# Patient Record
Sex: Female | Born: 1955 | Hispanic: Yes | Marital: Married | Smoking: Never smoker
Health system: Southern US, Community
[De-identification: ages and names within clinical notes are randomized; demographics above are authoritative.]

## PROBLEM LIST (undated history)

## (undated) DIAGNOSIS — E119 Type 2 diabetes mellitus without complications: Secondary | ICD-10-CM

---

## 2006-09-01 ENCOUNTER — Ambulatory Visit: Payer: Self-pay | Admitting: Family Medicine

## 2008-09-28 ENCOUNTER — Ambulatory Visit: Payer: Self-pay

## 2008-11-25 ENCOUNTER — Ambulatory Visit: Payer: Self-pay

## 2010-11-08 ENCOUNTER — Ambulatory Visit: Payer: Self-pay

## 2011-12-12 ENCOUNTER — Ambulatory Visit: Payer: Self-pay

## 2012-12-17 ENCOUNTER — Ambulatory Visit: Payer: Self-pay

## 2013-12-17 ENCOUNTER — Ambulatory Visit: Payer: Self-pay

## 2014-12-21 ENCOUNTER — Ambulatory Visit: Payer: Self-pay

## 2015-01-01 ENCOUNTER — Ambulatory Visit: Payer: Self-pay

## 2016-02-16 ENCOUNTER — Ambulatory Visit
Admission: RE | Admit: 2016-02-16 | Discharge: 2016-02-16 | Disposition: A | Discharge status: Home / Self Care | Payer: Self-pay | Source: Ambulatory Visit | Attending: Oncology | Admitting: Oncology

## 2016-02-16 ENCOUNTER — Ambulatory Visit: Payer: Self-pay | Attending: Oncology | Admitting: *Deleted

## 2016-02-16 ENCOUNTER — Encounter: Payer: Self-pay | Admitting: *Deleted

## 2016-02-16 VITALS — BP 117/75 | HR 72 | Temp 97.4°F | Ht 61.42 in | Wt 152.9 lb

## 2016-02-16 DIAGNOSIS — Z0000 Encounter for general adult medical examination without abnormal findings: Secondary | ICD-10-CM

## 2016-02-16 NOTE — Patient Instructions (Signed)
Gave patient hand-out, Women Staying Healthy, Active and Well from [facility], with education on breast health, pap smears, heart and colon health. 

## 2016-02-16 NOTE — Progress Notes (Signed)
Subjective:     Patient ID: Sharon Lynch, female   DOB: September 24, 1956, 60 y.o.   MRN: [id number]  HPI   Review of Systems     Objective:   Physical Exam  Pulmonary/Chest: Right breast exhibits no inverted nipple, no mass, no nipple discharge, no skin change and no tenderness. Left breast exhibits no inverted nipple, no mass, no nipple discharge, no skin change and no tenderness. Breasts are symmetrical.  Abdominal: There is no splenomegaly or hepatomegaly.    Genitourinary: No labial fusion. There is no rash, tenderness, lesion or injury on the right labia. There is no rash, tenderness, lesion or injury on the left labia. Cervix exhibits no motion tenderness, no discharge and no friability. Right adnexum displays no mass, no tenderness and no fullness. Left adnexum displays no mass, no tenderness and no fullness. No erythema, tenderness or bleeding in the vagina. No foreign body around the vagina. No signs of injury around the vagina. No vaginal discharge found.  Os is very stenosed       Assessment:     60 year old Hispanic [facility]  Maryjane HurterOtto the interpreter, present during the interview, but stepped out during the exam per patient request.  Clinical breast exam unremarkable.  Taught self breast awareness.  Pelvic exam without masses or lesions.  Patient will be due for pap 2019 if no HPV or 2021 if HPV negative.  Patient has been screened for eligibility.  She does not have any insurance, Medicare or Medicaid.  She also meets financial eligibility.  Hand-out given on the Affordable Care Act.      Plan:     Screening mammogram ordered.  Will follow-up per [facility] protocol.

## 2016-02-22 ENCOUNTER — Encounter: Payer: Self-pay | Admitting: *Deleted

## 2016-02-22 NOTE — Progress Notes (Signed)
Letter mailed to inform patient of her mammogram results.  She is to follow-up in one year with annual screening.  HSIS to Christy.  

## 2017-02-21 ENCOUNTER — Ambulatory Visit: Payer: Self-pay | Attending: Oncology | Admitting: *Deleted

## 2017-02-21 ENCOUNTER — Ambulatory Visit
Admission: RE | Admit: 2017-02-21 | Discharge: 2017-02-21 | Disposition: A | Discharge status: Home / Self Care | Payer: Self-pay | Source: Ambulatory Visit | Attending: Oncology | Admitting: Oncology

## 2017-02-21 ENCOUNTER — Encounter: Payer: Self-pay | Admitting: *Deleted

## 2017-02-21 VITALS — BP 120/78 | HR 72 | Temp 92.2°F | Resp 18 | Ht 62.0 in | Wt 156.2 lb

## 2017-02-21 DIAGNOSIS — Z0000 Encounter for general adult medical examination without abnormal findings: Secondary | ICD-10-CM

## 2017-02-21 NOTE — Patient Instructions (Signed)
Gave patient hand-out, Women Staying Healthy, Active and Well from [facility], with education on breast health, pap smears, heart and colon health. 

## 2017-02-21 NOTE — Progress Notes (Signed)
Subjective:     Patient ID: Sharon Lynch, female   DOB: 15-Aug-1956, 61 y.o.   MRN: [id number]  HPI   Review of Systems     Objective:   Physical Exam  Pulmonary/Chest: Right breast exhibits no inverted nipple, no mass, no nipple discharge, no skin change and no tenderness. Left breast exhibits no inverted nipple, no mass, no nipple discharge, no skin change and no tenderness. Breasts are symmetrical.  Abdominal: There is no splenomegaly or hepatomegaly.    Genitourinary: No labial fusion. There is no rash, tenderness, lesion or injury on the right labia. There is no rash, tenderness, lesion or injury on the left labia. Uterus is not deviated, not enlarged and not fixed. Cervix exhibits no motion tenderness, no discharge and no friability. Right adnexum displays no mass, no tenderness and no fullness. Left adnexum displays no mass, no tenderness and no fullness. No erythema, tenderness or bleeding in the vagina. No foreign body in the vagina. No signs of injury around the vagina. No vaginal discharge found.       Assessment:     61 year old Hispanic female returns to [facility] for annual screening.  Sharon Lynch, the interpreter present during the interview and exam.  Clinical breast exam unremarkable.  Taught self breast awareness.  Pelvic exam without masses or lesions.  Last pap 12/17/13 was -/-.  Next pap due 2020.  Patient has been screened for eligibility.  She does not have any insurance, Medicare or Medicaid.  She also meets financial eligibility.  Hand-out given on the Affordable Care Act.    Plan:     Screening mammogram ordered.  Will follow-up per [facility] protocol.

## 2017-02-22 ENCOUNTER — Encounter: Payer: Self-pay | Admitting: *Deleted

## 2017-02-22 NOTE — Progress Notes (Signed)
Letter mailed to inform patient of her normal mammogram results.  She is to follow-up in one year with annual screening.  HSIS to Christy. 

## 2018-03-18 ENCOUNTER — Ambulatory Visit: Payer: Self-pay

## 2018-03-20 ENCOUNTER — Ambulatory Visit: Payer: Self-pay | Attending: Oncology | Admitting: *Deleted

## 2018-03-20 ENCOUNTER — Encounter: Payer: Self-pay | Admitting: *Deleted

## 2018-03-20 ENCOUNTER — Other Ambulatory Visit: Payer: Self-pay

## 2018-03-20 ENCOUNTER — Ambulatory Visit
Admission: RE | Admit: 2018-03-20 | Discharge: 2018-03-20 | Disposition: A | Discharge status: Home / Self Care | Payer: Self-pay | Source: Ambulatory Visit | Attending: Oncology | Admitting: Oncology

## 2018-03-20 VITALS — BP 113/66 | Resp 12 | Ht 62.0 in | Wt 149.0 lb

## 2018-03-20 DIAGNOSIS — Z0000 Encounter for general adult medical examination without abnormal findings: Secondary | ICD-10-CM

## 2018-03-20 NOTE — Patient Instructions (Signed)
Gave patient hand-out, Women Staying Healthy, Active and Well from [facility], with education on breast health, pap smears, heart and colon health. 

## 2018-03-20 NOTE — Progress Notes (Signed)
Letter mailed from [facility] to inform patient of her normal mammogram results.  Patient is to follow-up with annual screening in one year.  HSIS to Christy. 

## 2018-03-20 NOTE — Progress Notes (Signed)
  Subjective:     Patient ID: Sharon Lynch, female   DOB: Jul 26, 1956, 62 y.o.   MRN: [id number]  HPI   Review of Systems     Objective:   Physical Exam  Pulmonary/Chest: Right breast exhibits no inverted nipple, no mass, no nipple discharge, no skin change and no tenderness. Left breast exhibits no inverted nipple, no mass, no nipple discharge, no skin change and no tenderness.       Assessment:     62 year old Hispanic female returns to [facility] for annual screening.  Myriam Jacobson the interpreter present during the interview and exam.  Clinical breast exam unremarkable.  Taught self breast awareness.  Last pap on 12/17/13 was negative / negative.  Next pap due in 2020. Patient has been screened for eligibility.  She does not have any insurance, Medicare or Medicaid.  She also meets financial eligibility.  Hand-out given on the Affordable Care Act.    Plan:     Screening mammogram ordered.  Will follow-up per [facility] protocol.

## 2019-02-08 ENCOUNTER — Encounter: Payer: Self-pay | Admitting: Emergency Medicine

## 2019-02-08 ENCOUNTER — Other Ambulatory Visit: Payer: Self-pay

## 2019-02-08 ENCOUNTER — Emergency Department

## 2019-02-08 ENCOUNTER — Emergency Department
Admission: EM | Admit: 2019-02-08 | Discharge: 2019-02-08 | Disposition: A | Discharge status: Home / Self Care | Attending: Emergency Medicine | Admitting: Emergency Medicine

## 2019-02-08 DIAGNOSIS — Z23 Encounter for immunization: Secondary | ICD-10-CM | POA: Diagnosis not present

## 2019-02-08 DIAGNOSIS — S60352A Superficial foreign body of left thumb, initial encounter: Secondary | ICD-10-CM | POA: Diagnosis not present

## 2019-02-08 DIAGNOSIS — Y9289 Other specified places as the place of occurrence of the external cause: Secondary | ICD-10-CM | POA: Diagnosis not present

## 2019-02-08 DIAGNOSIS — W458XXA Other foreign body or object entering through skin, initial encounter: Secondary | ICD-10-CM | POA: Diagnosis not present

## 2019-02-08 DIAGNOSIS — M795 Residual foreign body in soft tissue: Secondary | ICD-10-CM

## 2019-02-08 DIAGNOSIS — Y9389 Activity, other specified: Secondary | ICD-10-CM | POA: Diagnosis not present

## 2019-02-08 DIAGNOSIS — Y998 Other external cause status: Secondary | ICD-10-CM | POA: Insufficient documentation

## 2019-02-08 HISTORY — DX: Type 2 diabetes mellitus without complications: E11.9

## 2019-02-08 MED ORDER — CEPHALEXIN 500 MG PO CAPS: 500.0000 mg | ORAL_CAPSULE | Freq: Three times a day (TID) | ORAL | 0 refills | Status: AC

## 2019-02-08 MED ORDER — LIDOCAINE HCL 1 % IJ SOLN
5.0000 mL | Freq: Once | INTRAMUSCULAR | Status: AC
  Administered 2019-02-08: 2 mL
  Filled 2019-02-08: qty 10

## 2019-02-08 MED ORDER — TETANUS-DIPHTH-ACELL PERTUSSIS 5-2.5-18.5 LF-MCG/0.5 IM SUSP
0.5000 mL | Freq: Once | INTRAMUSCULAR | Status: AC
  Administered 2019-02-08: 0.5 mL via INTRAMUSCULAR
  Filled 2019-02-08: qty 0.5

## 2019-02-08 NOTE — ED Triage Notes (Signed)
Pt to ED via POV stating that she was pierced by a staple at work today in her right thumb . Pt is in NAD. At this time.

## 2019-02-08 NOTE — ED Notes (Addendum)
Pt has staple in left thumb from staple gun while at work. 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

## 2019-02-08 NOTE — ED Notes (Signed)
Pt verbalized understanding of d/c instructions, RX, and f/u care. No further questions at this time. Pt ambulatory to the exit with steady gait Interpreter, Marchelle Folks,  used to assist in pt d/c.

## 2019-02-08 NOTE — ED Provider Notes (Signed)
[facility] Emergency Department Provider Note  ____________________________________________  Time seen: Approximately 7:30 PM  I have reviewed the triage vital signs and the nursing notes.   HISTORY  Chief Complaint Finger Injury    HPI Sharon Lynch is a 63 y.o. female presents to the emergency department with a left thumb foreign body, staple, sustained accidentally at work.  Patient does not remember her last tetanus shot.  No numbness or tingling in the left hand.  Patient has had pain at the site where the staple penetrated digit. No alleviating measures have been attempted.         Past Medical History:  Diagnosis Date  . Diabetes mellitus without complication (HCC)     There are no active problems to display for this patient.   Past Surgical History:  Procedure Laterality Date  . CESAREAN SECTION      Prior to Admission medications   Not on File    Allergies Patient has no known allergies.  Family History  Problem Relation Age of Onset  . Breast cancer Neg Hx     Social History Social History   Tobacco Use  . Smoking status: Never Smoker  . Smokeless tobacco: Never Used  Substance Use Topics  . Alcohol use: Not Currently  . Drug use: Not Currently     Review of Systems  Constitutional: No fever/chills Eyes: No visual changes. No discharge ENT: No upper respiratory complaints. Cardiovascular: no chest pain. Respiratory: no cough. No SOB. Gastrointestinal: No abdominal pain.  No nausea, no vomiting.  No diarrhea.  No constipation. Genitourinary: Negative for dysuria. No hematuria Musculoskeletal: Negative for musculoskeletal pain. Skin: Patient has left thumb foreign body.  Neurological: Negative for headaches, focal weakness or numbness.   ____________________________________________   PHYSICAL EXAM:  VITAL SIGNS: ED Triage Vitals  Enc Vitals Group     BP 02/08/19 1752 140/63     Pulse Rate 02/08/19  1752 71     Resp 02/08/19 1752 18     Temp 02/08/19 1752 98.5 F (36.9 C)     Temp Source 02/08/19 1752 Oral     SpO2 02/08/19 1752 97 %     Weight 02/08/19 1804 153 lb (69.4 kg)     Height 02/08/19 1804 5\' 2"  (1.575 m)     Head Circumference --      Peak Flow --      Pain Score 02/08/19 1804 3     Pain Loc --      Pain Edu? --      Excl. in GC? --      Constitutional: Alert and oriented. Well appearing and in no acute distress. Eyes: Conjunctivae are normal. PERRL. EOMI. Head: Atraumatic. Cardiovascular: Normal rate, regular rhythm. Normal S1 and S2.  Good peripheral circulation. Respiratory: Normal respiratory effort without tachypnea or retractions. Lungs CTAB. Good air entry to the bases with no decreased or absent breath sounds. Gastrointestinal: Bowel sounds 4 quadrants. Soft and nontender to palpation. No guarding or rigidity. No palpable masses. No distention. No CVA tenderness. Musculoskeletal: Full range of motion to all extremities. No gross deformities appreciated. Palpable radial pulse  Neurologic:  Normal speech and language. No gross focal neurologic deficits are appreciated.  Skin: Patient has staple foreign body along the volar aspect of the left thumb at fingertip.  Psychiatric: Mood and affect are normal. Speech and behavior are normal. Patient exhibits appropriate insight and judgement.   ____________________________________________   LABS (all labs ordered are listed,  but only abnormal results are displayed)  Labs Reviewed - No data to display ____________________________________________  EKG   ____________________________________________  RADIOLOGY   No results found.  ____________________________________________    PROCEDURES  Procedure(s) performed:    Procedures  Patient's right thumb was anesthetized using 1% lidocaine without epinephrine at staple penetration sites. Staple was removed using hemostat.   Medications  lidocaine  (XYLOCAINE) 1 % (with pres) injection 5 mL (has no administration in time range)     ____________________________________________   INITIAL IMPRESSION / ASSESSMENT AND PLAN / ED COURSE  Pertinent labs & imaging results that were available during my care of the patient were reviewed by me and considered in my medical decision making (see chart for details).  Review of the [place] CSRS was performed in accordance of the [facility] prior to dispensing any controlled drugs.         Assessment and plan Left thumb foreign body Patient presents to the emergency department with a stable foreign body of the left thumb.  Staple was removed without complication and patient was discharged with Keflex.  Patient's tetanus status is updated in the emergency department.    ____________________________________________  FINAL CLINICAL IMPRESSION(S) / ED DIAGNOSES  Final diagnoses:  Foreign body (FB) in soft tissue      NEW MEDICATIONS STARTED DURING THIS VISIT:  ED Discharge Orders    None          This chart was dictated using voice recognition software/Dragon. Despite best efforts to proofread, errors can occur which can change the meaning. Any change was purely unintentional.    Orvil Feil, PA-C 02/08/19 [name]    Phineas Semen, MD 02/08/19 [phone]

## 2020-01-13 ENCOUNTER — Other Ambulatory Visit: Payer: Self-pay | Admitting: Obstetrics and Gynecology

## 2020-01-13 DIAGNOSIS — Z1231 Encounter for screening mammogram for malignant neoplasm of breast: Secondary | ICD-10-CM

## 2020-01-29 ENCOUNTER — Ambulatory Visit
Admission: RE | Admit: 2020-01-29 | Discharge: 2020-01-29 | Disposition: A | Discharge status: Home / Self Care | Payer: BC Managed Care – PPO | Source: Ambulatory Visit | Attending: Obstetrics and Gynecology | Admitting: Obstetrics and Gynecology

## 2020-01-29 DIAGNOSIS — Z1231 Encounter for screening mammogram for malignant neoplasm of breast: Secondary | ICD-10-CM | POA: Insufficient documentation

## 2020-02-08 ENCOUNTER — Ambulatory Visit: Payer: BC Managed Care – PPO | Attending: Internal Medicine

## 2020-02-08 DIAGNOSIS — Z23 Encounter for immunization: Secondary | ICD-10-CM

## 2020-02-08 NOTE — Progress Notes (Signed)
   Covid-19 Vaccination Clinic  Name:  Sharon Lynch    MRN: [id number] DOB: 1956-06-12  02/08/2020  Ms. [name] was observed post Covid-19 immunization for 15 minutes without incident. She was provided with Vaccine Information Sheet and instruction to access the V-Safe system.   Ms. [name] was instructed to call 911 with any severe reactions post vaccine: Marland Kitchen Difficulty breathing  . Swelling of face and throat  . A fast heartbeat  . A bad rash all over body  . Dizziness and weakness   Immunizations Administered    Name Date Dose VIS Date Route   Pfizer COVID-19 Vaccine 02/08/2020  1:46 PM 0.3 mL 10/31/2019 Intramuscular   Manufacturer: [employer], [employer]   Lot: [id number]   NDC: [address]-7

## 2020-02-29 ENCOUNTER — Ambulatory Visit: Payer: BC Managed Care – PPO | Attending: Internal Medicine

## 2020-02-29 DIAGNOSIS — Z23 Encounter for immunization: Secondary | ICD-10-CM

## 2020-02-29 NOTE — Progress Notes (Signed)
   Covid-19 Vaccination Clinic  Name:  Etta Gassett    MRN: [id number] DOB: 1956/02/27  02/29/2020  Ms. [name] was observed post Covid-19 immunization for 15 minutes without incident. She was provided with Vaccine Information Sheet and instruction to access the V-Safe system.   Ms. Merrilee Ancona was instructed to call 911 with any severe reactions post vaccine: Marland Kitchen Difficulty breathing  . Swelling of face and throat  . A fast heartbeat  . A bad rash all over body  . Dizziness and weakness   Immunizations Administered    Name Date Dose VIS Date Route   Pfizer COVID-19 Vaccine 02/29/2020 12:49 PM 0.3 mL 10/31/2019 Intramuscular   Manufacturer: [employer], [employer]   Lot: [id number]   NDC: [address]-5

## 2021-02-02 ENCOUNTER — Ambulatory Visit
Admission: RE | Admit: 2021-02-02 | Discharge: 2021-02-02 | Disposition: A | Discharge status: Home / Self Care | Payer: Self-pay | Source: Ambulatory Visit | Attending: Oncology | Admitting: Oncology

## 2021-02-02 ENCOUNTER — Other Ambulatory Visit: Payer: Self-pay

## 2021-02-02 ENCOUNTER — Ambulatory Visit: Payer: Self-pay | Attending: Oncology

## 2021-02-02 VITALS — BP 136/65 | HR 78 | Temp 98.3°F | Ht 61.81 in | Wt 153.3 lb

## 2021-02-02 DIAGNOSIS — Z0000 Encounter for general adult medical examination without abnormal findings: Secondary | ICD-10-CM

## 2021-02-02 NOTE — Progress Notes (Signed)
  Subjective:     Patient ID: Sharon Lynch, female   DOB: November 29, 1955, 65 y.o.   MRN: [id number]  HPI   Review of Systems     Objective:   Physical Exam Chest:  Breasts:     Right: No swelling, bleeding, inverted nipple, mass, nipple discharge, skin change or tenderness.     Left: No swelling, bleeding, inverted nipple, mass, nipple discharge, skin change or tenderness.          Assessment:  65 year old Hispanic patient returns for annual screening.  Maritza Afanador interpreted.  Patient screened, and meets [facility] eligibility.  Patient does not have insurance, Medicare or Medicaid. Instructed patient on breast self awareness using teach back method.  Clinical breast exam unremarkable.  No mass or lump palpated. Risk Assessment    Risk Scores      02/02/2021   Last edited by: Scarlett Presto, RN   5-year risk: 0.7 %   Lifetime risk: 3.1 %            Plan:     Sent for bilateral screening mammogram.

## 2021-04-07 NOTE — Progress Notes (Signed)
Letter mailed from [facility] to notify of normal mammogram results.  Patient to return in one year for annual screening.  Copy to HSIS. 

## 2022-01-04 ENCOUNTER — Other Ambulatory Visit: Payer: Self-pay | Admitting: Obstetrics and Gynecology

## 2022-01-04 DIAGNOSIS — Z1231 Encounter for screening mammogram for malignant neoplasm of breast: Secondary | ICD-10-CM

## 2022-02-14 ENCOUNTER — Other Ambulatory Visit: Payer: Self-pay

## 2022-02-14 ENCOUNTER — Ambulatory Visit
Admission: RE | Admit: 2022-02-14 | Discharge: 2022-02-14 | Disposition: A | Discharge status: Home / Self Care | Payer: BC Managed Care – PPO | Source: Ambulatory Visit | Attending: Obstetrics and Gynecology | Admitting: Obstetrics and Gynecology

## 2022-02-14 DIAGNOSIS — Z1231 Encounter for screening mammogram for malignant neoplasm of breast: Secondary | ICD-10-CM | POA: Insufficient documentation

## 2022-05-02 IMAGING — MG MM DIGITAL SCREENING BILAT W/ TOMO AND CAD
6 of 10 series · 6 of 30 positions shown · non-contrast
Comparison: Previous exam(s).

CLINICAL DATA: Screening.

EXAM:
DIGITAL SCREENING BILATERAL MAMMOGRAM WITH TOMOSYNTHESIS AND CAD
TECHNIQUE: Bilateral screening digital craniocaudal and mediolateral oblique
mammograms were obtained. Bilateral screening digital breast
tomosynthesis was performed. The images were evaluated with
computer-aided detection.

[L MLO synth-2D]
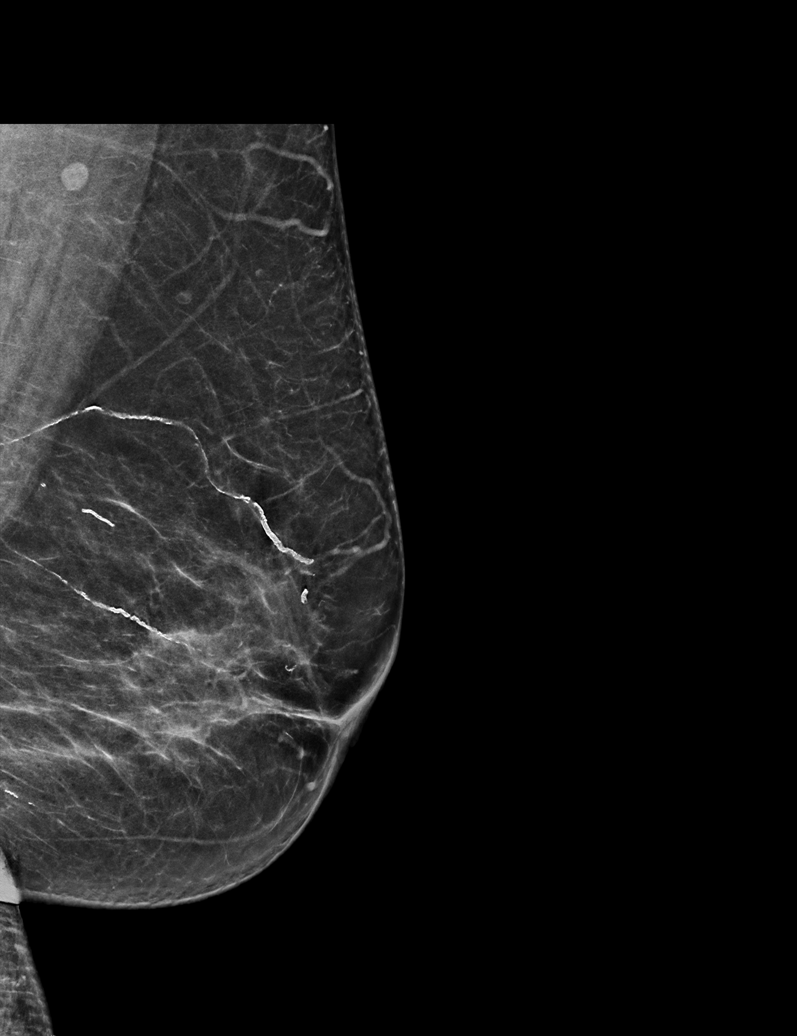

[R MLO synth-2D (1 of 2)]
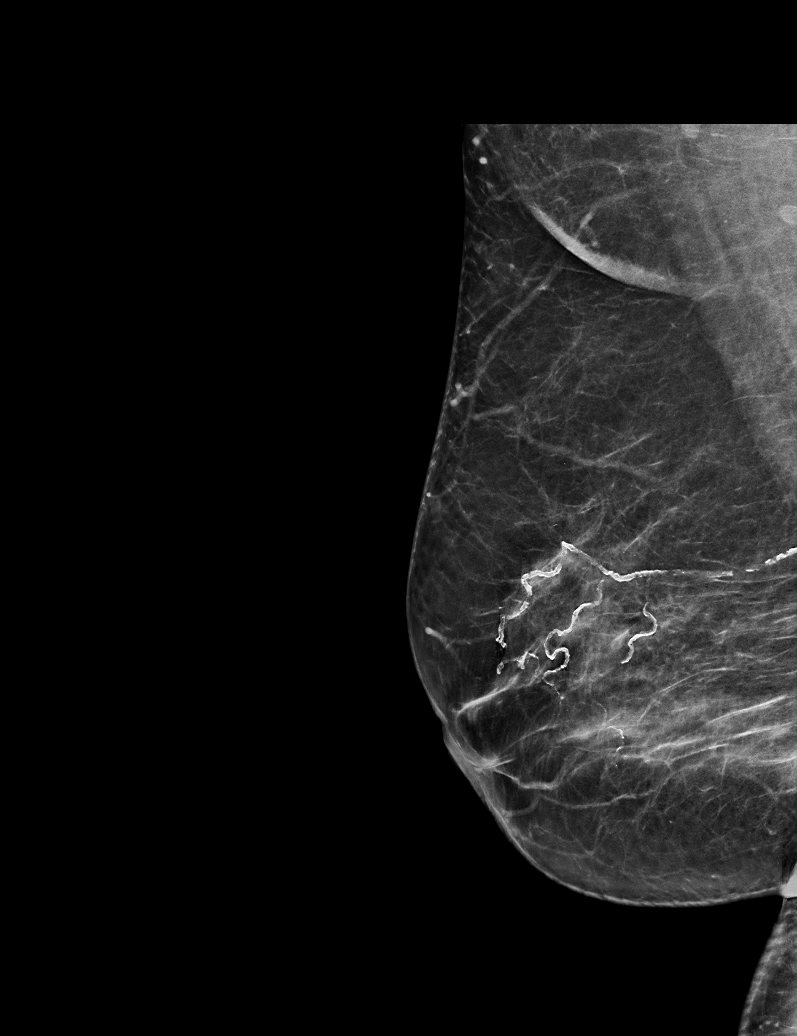

[R MLO synth-2D (2 of 2)]
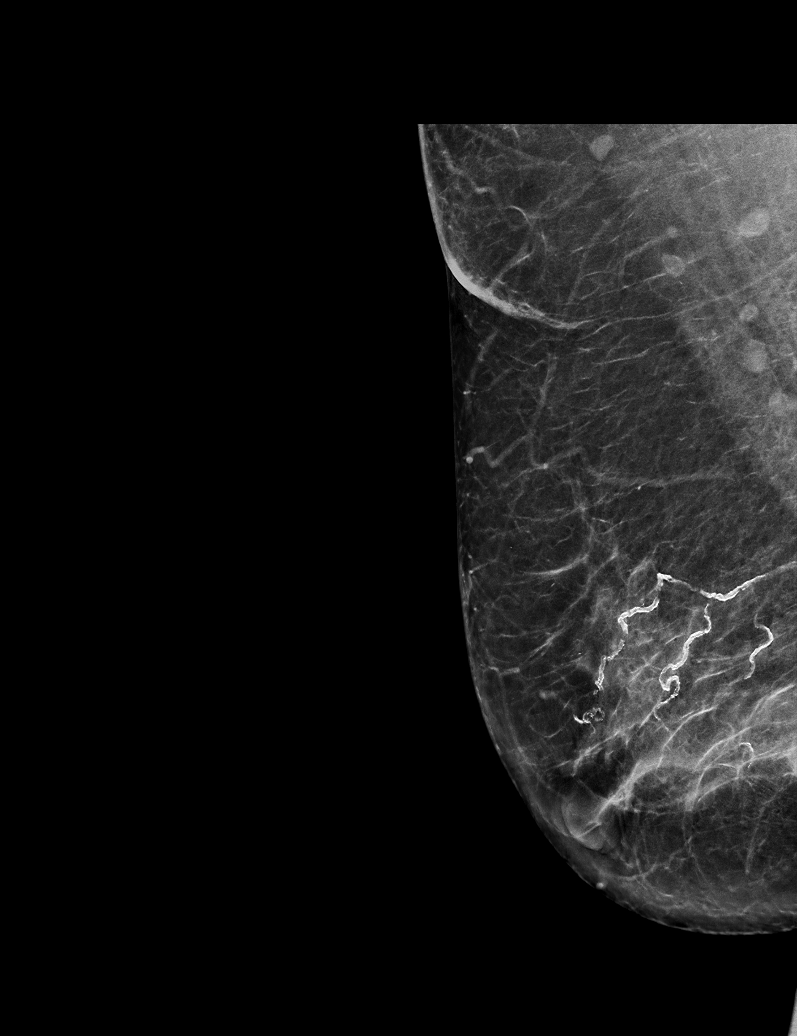

[R CC synth-2D]
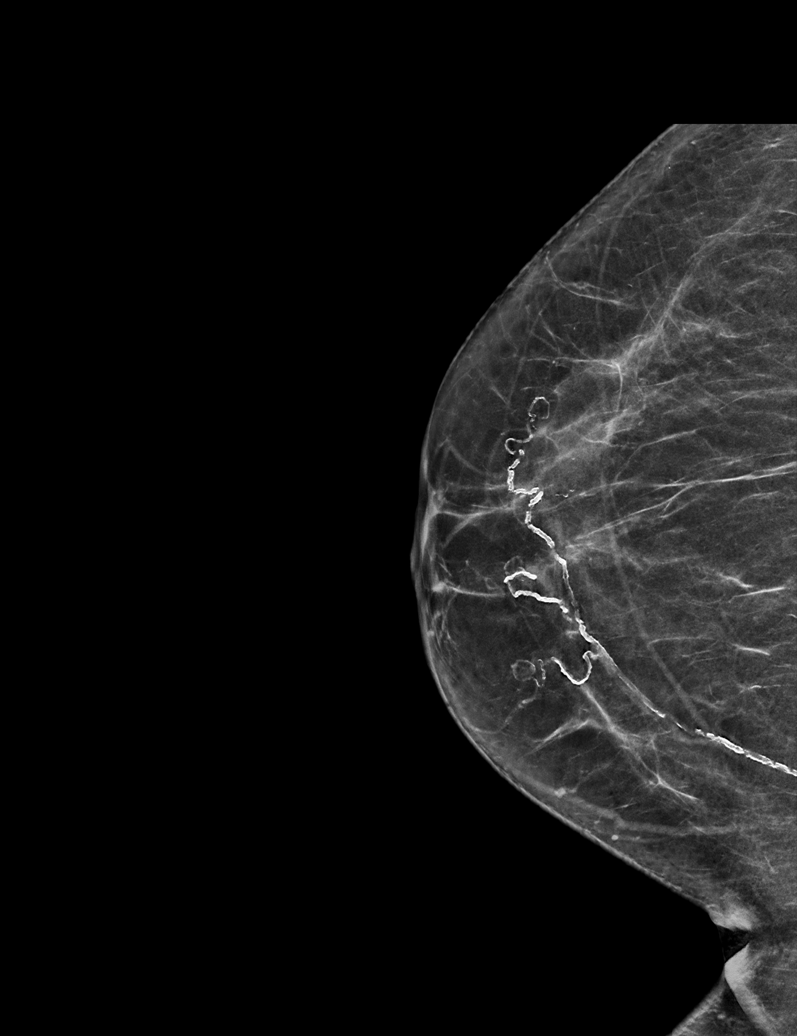

[L CC synth-2D]
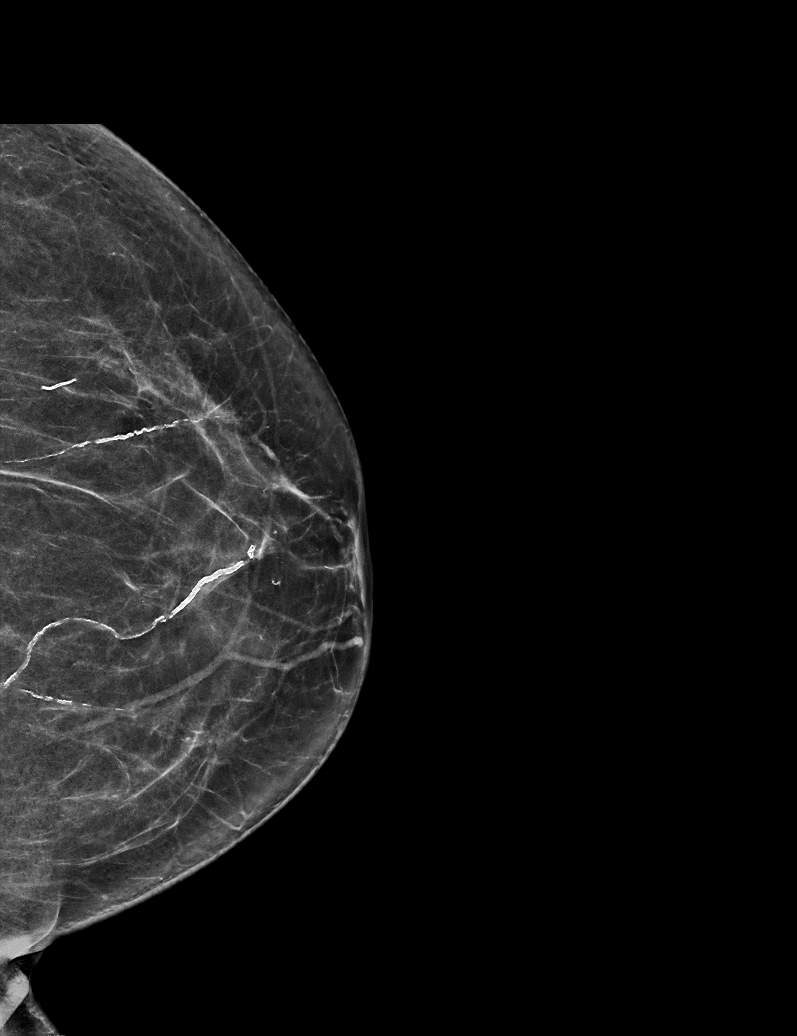

[L CC tomo · tomo slice 30/59.0]
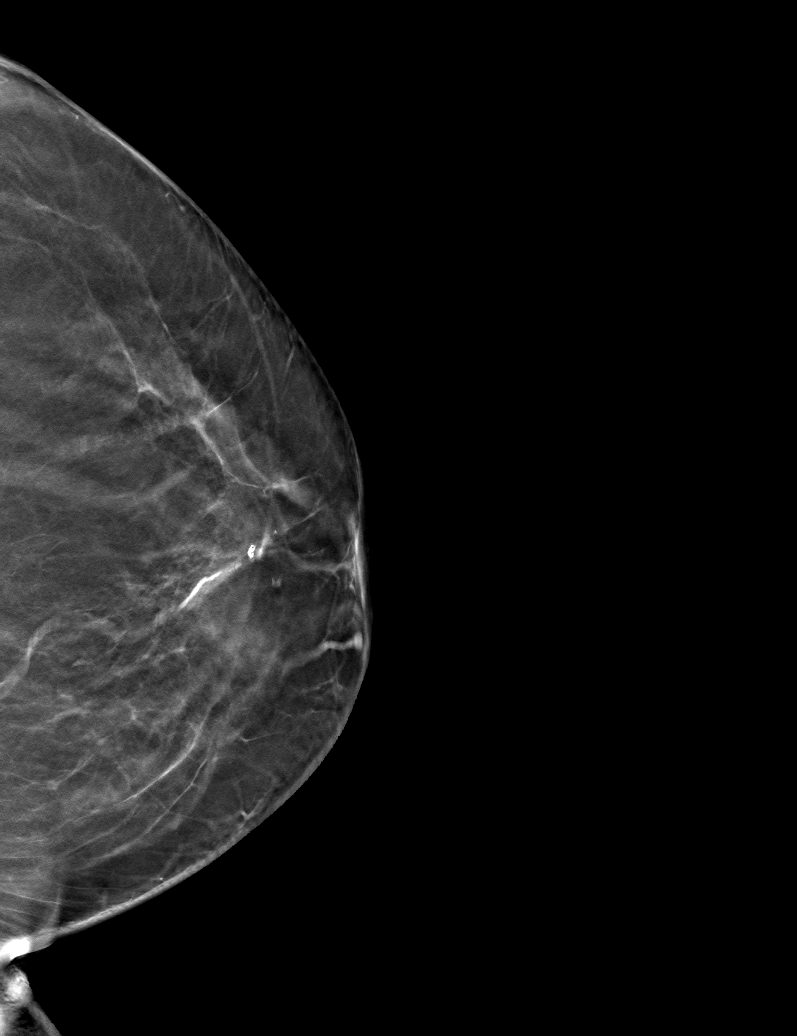

[6 of 30 positions shown; findings below may reference images not displayed]

ACR Breast Density Category b: There are scattered areas of
fibroglandular density.
FINDINGS: There are no findings suspicious for malignancy.
IMPRESSION: No mammographic evidence of malignancy. A result letter of this
screening mammogram will be mailed directly to the patient.

RECOMMENDATION:
Screening mammogram in one year. (Code:51-O-LD2)

BI-RADS CATEGORY  1: Negative.

## 2022-12-14 ENCOUNTER — Ambulatory Visit
Admission: RE | Admit: 2022-12-14 | Discharge: 2022-12-14 | Disposition: A | Discharge status: Home / Self Care | Payer: BC Managed Care – PPO | Source: Ambulatory Visit | Attending: Family Medicine | Admitting: Family Medicine

## 2022-12-14 ENCOUNTER — Other Ambulatory Visit: Payer: Self-pay | Admitting: Family Medicine

## 2022-12-14 ENCOUNTER — Ambulatory Visit
Admission: RE | Admit: 2022-12-14 | Discharge: 2022-12-14 | Disposition: A | Discharge status: Home / Self Care | Payer: BC Managed Care – PPO | Attending: Family Medicine | Admitting: Family Medicine

## 2022-12-14 DIAGNOSIS — M25562 Pain in left knee: Secondary | ICD-10-CM | POA: Insufficient documentation

## 2022-12-28 ENCOUNTER — Other Ambulatory Visit: Payer: Self-pay | Admitting: Obstetrics and Gynecology

## 2022-12-28 DIAGNOSIS — M25562 Pain in left knee: Secondary | ICD-10-CM

## 2022-12-31 ENCOUNTER — Ambulatory Visit
Admission: RE | Admit: 2022-12-31 | Discharge: 2022-12-31 | Disposition: A | Discharge status: Home / Self Care | Payer: BC Managed Care – PPO | Source: Ambulatory Visit | Attending: Obstetrics and Gynecology | Admitting: Obstetrics and Gynecology

## 2022-12-31 DIAGNOSIS — M25562 Pain in left knee: Secondary | ICD-10-CM | POA: Insufficient documentation

## 2022-12-31 MED ORDER — GADOBUTROL 1 MMOL/ML IV SOLN
7.0000 mL | Freq: Once | INTRAVENOUS | Status: AC | PRN
  Administered 2022-12-31: 7.5 mL via INTRAVENOUS

## 2023-01-22 ENCOUNTER — Other Ambulatory Visit: Payer: Self-pay | Admitting: Obstetrics and Gynecology

## 2023-01-22 DIAGNOSIS — Z1231 Encounter for screening mammogram for malignant neoplasm of breast: Secondary | ICD-10-CM

## 2023-02-16 ENCOUNTER — Ambulatory Visit
Admission: RE | Admit: 2023-02-16 | Discharge: 2023-02-16 | Disposition: A | Discharge status: Home / Self Care | Payer: BC Managed Care – PPO | Source: Ambulatory Visit | Attending: Obstetrics and Gynecology | Admitting: Obstetrics and Gynecology

## 2023-02-16 DIAGNOSIS — Z1231 Encounter for screening mammogram for malignant neoplasm of breast: Secondary | ICD-10-CM | POA: Insufficient documentation

## 2024-01-28 ENCOUNTER — Other Ambulatory Visit: Payer: Self-pay | Admitting: Obstetrics and Gynecology

## 2024-01-28 DIAGNOSIS — Z1231 Encounter for screening mammogram for malignant neoplasm of breast: Secondary | ICD-10-CM

## 2024-02-18 ENCOUNTER — Ambulatory Visit
Admission: RE | Admit: 2024-02-18 | Discharge: 2024-02-18 | Disposition: A | Discharge status: Home / Self Care | Source: Ambulatory Visit | Attending: Obstetrics and Gynecology | Admitting: Obstetrics and Gynecology

## 2024-02-18 DIAGNOSIS — Z1231 Encounter for screening mammogram for malignant neoplasm of breast: Secondary | ICD-10-CM | POA: Insufficient documentation
